# Patient Record
Sex: Male | Born: 1995 | Race: White | Hispanic: No | Marital: Single | Smoking: Former smoker
Health system: Southern US, Community
[De-identification: ages and names within clinical notes are randomized; demographics above are authoritative.]

## PROBLEM LIST (undated history)

## (undated) DIAGNOSIS — F419 Anxiety disorder, unspecified: Secondary | ICD-10-CM

## (undated) DIAGNOSIS — F909 Attention-deficit hyperactivity disorder, unspecified type: Secondary | ICD-10-CM

## (undated) DIAGNOSIS — I10 Essential (primary) hypertension: Secondary | ICD-10-CM

## (undated) DIAGNOSIS — Z889 Allergy status to unspecified drugs, medicaments and biological substances status: Secondary | ICD-10-CM

## (undated) HISTORY — DX: Essential (primary) hypertension: I10

## (undated) HISTORY — DX: Attention-deficit hyperactivity disorder, unspecified type: F90.9

## (undated) HISTORY — DX: Allergy status to unspecified drugs, medicaments and biological substances: Z88.9

## (undated) HISTORY — PX: TYMPANOSTOMY TUBE PLACEMENT: SHX32

## (undated) HISTORY — DX: Anxiety disorder, unspecified: F41.9

## (undated) HISTORY — PX: ADENOIDECTOMY: SHX5191

---
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
## 2014-06-06 IMAGING — CT CT ABD-PELV W/O CM
1 series · 15 of 32 positions shown, 19 images · non-contrast
Comparison: none

REASON FOR EXAM: (1) Foreign body; (2) same
COMMENTS:

PROCEDURE:     CT  - CT ABDOMEN AND PELVIS W[DATE] [DATE]
RESULT:     Comparison: None
TECHNIQUE: Multiple axial images from the lung bases to the symphysis pubis
were obtained without oral and without intravenous contrast.

[Series 2: soft tissue · axial · 0.71mm/px · z∈[-876,-414]mm · 15 of 171 slices shown, 19 images]
[im 11/171  soft-tissue]
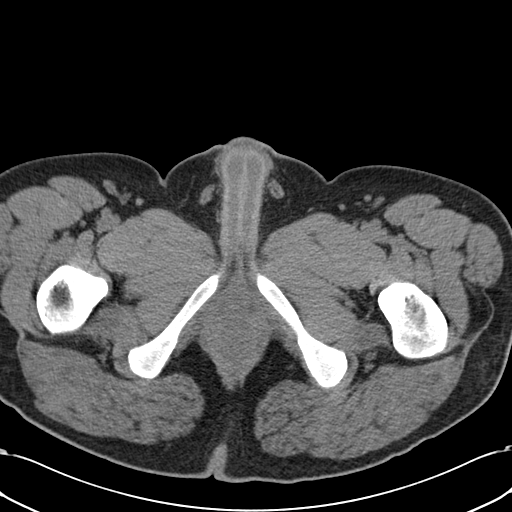
[im 11/171  bone]
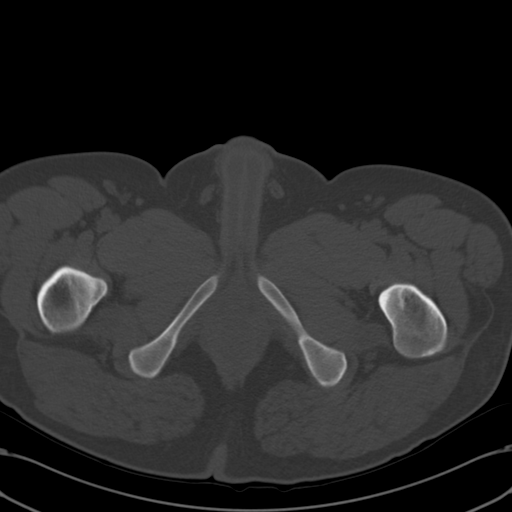
[im 22/171  soft-tissue]
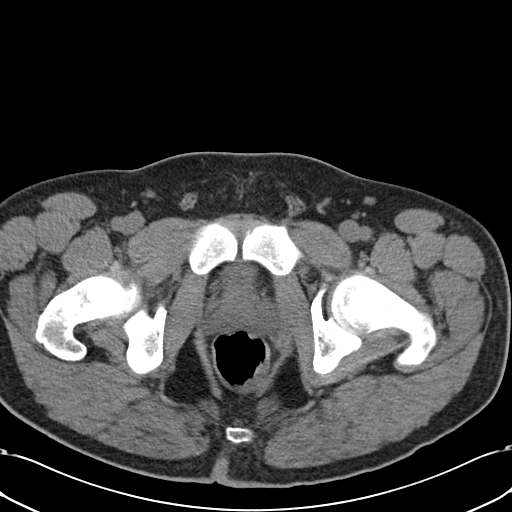
[im 33/171  soft-tissue]
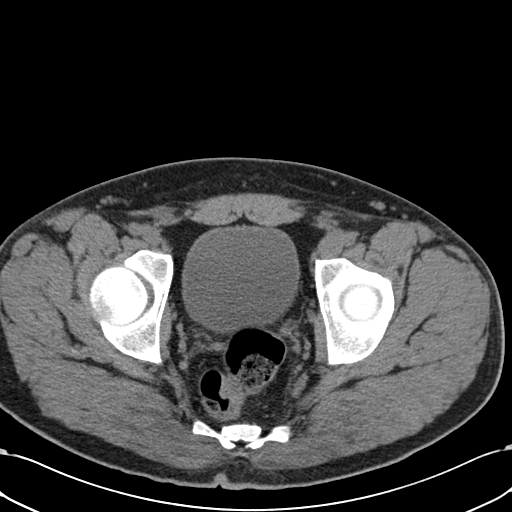
[im 50/171  soft-tissue]
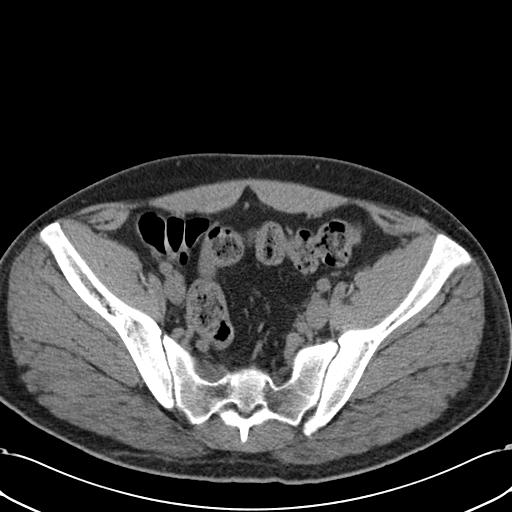
[im 61/171  soft-tissue]
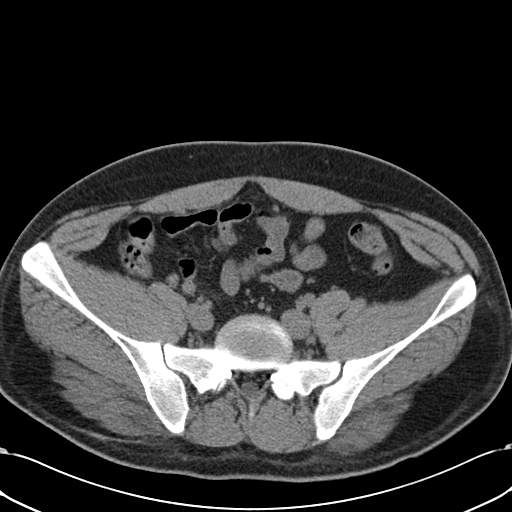
[im 72/171  soft-tissue]
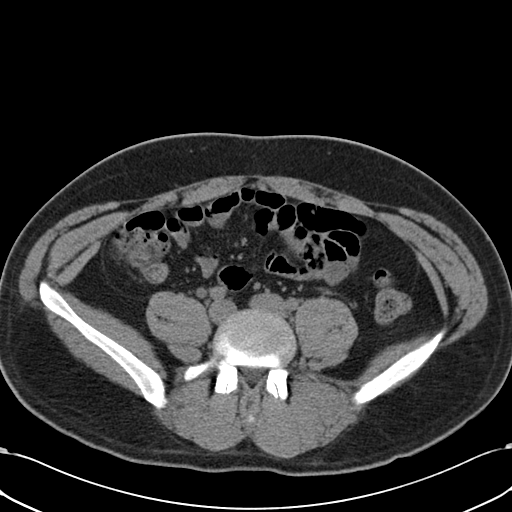
[im 88/171  soft-tissue]
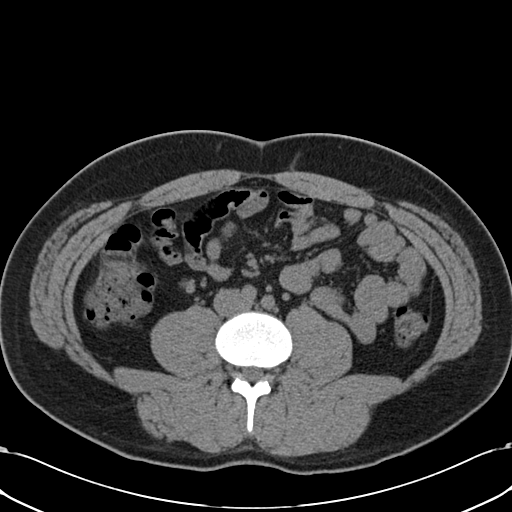
[im 99/171  soft-tissue]
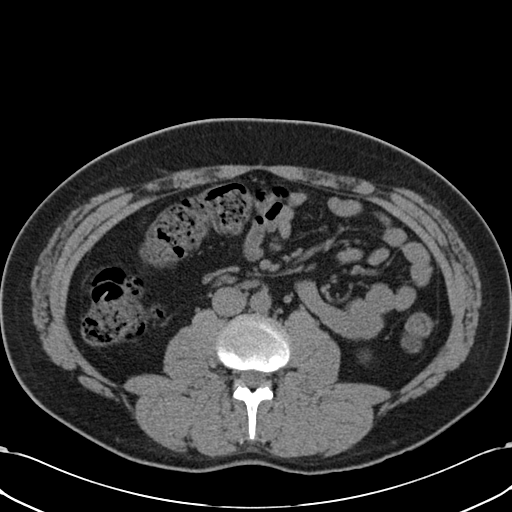
[im 110/171  soft-tissue]
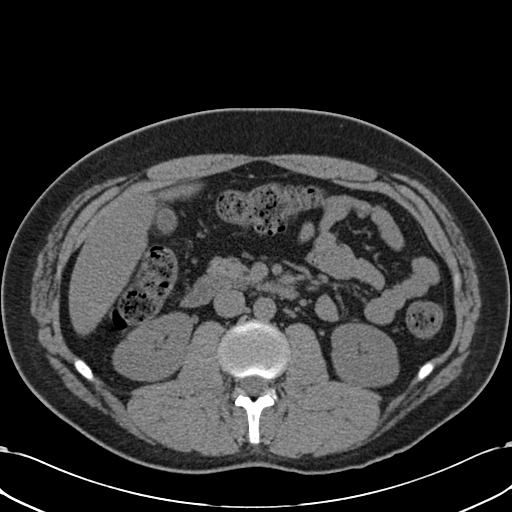
[im 110/171  bone]
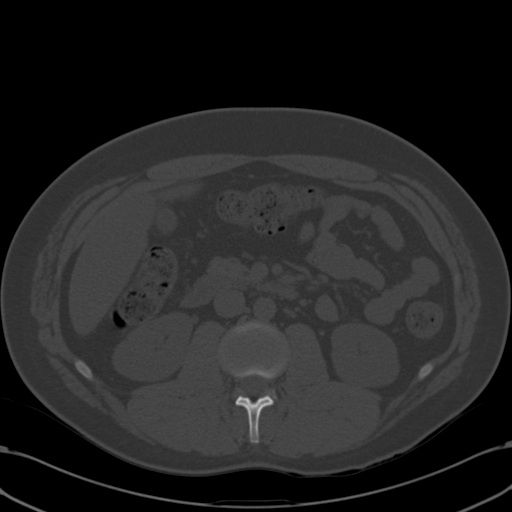
[im 121/171  soft-tissue]
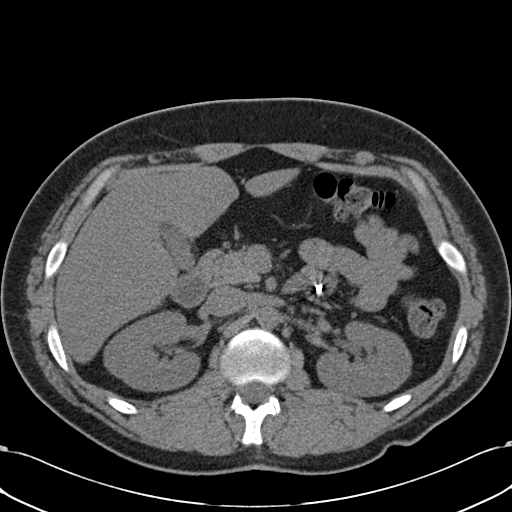
[im 138/171  soft-tissue]
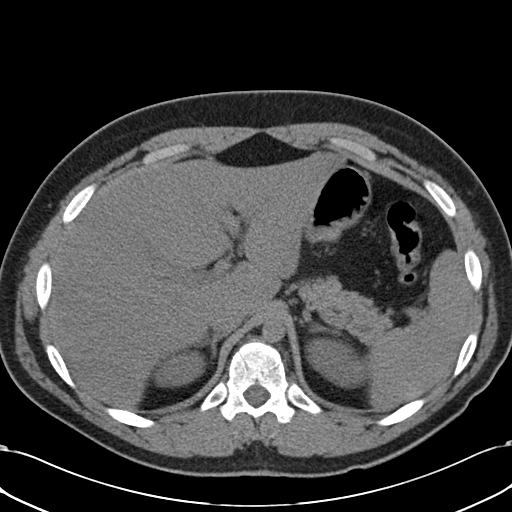
[im 149/171  soft-tissue]
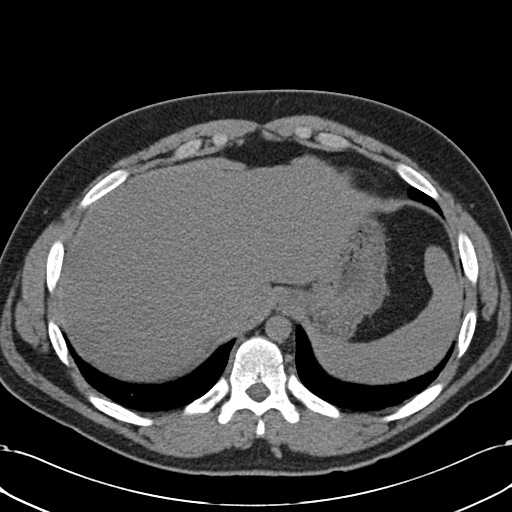
[im 149/171  lung]
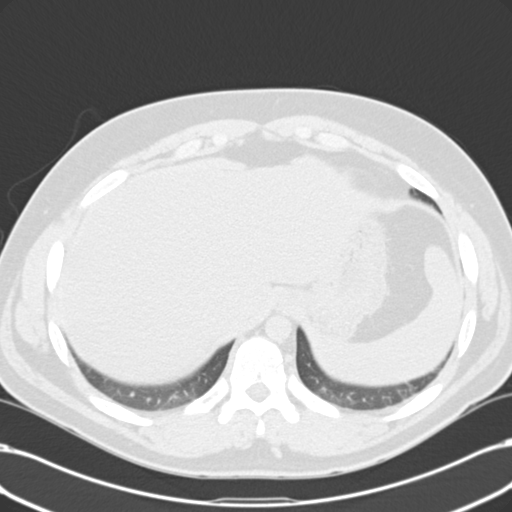
[im 154/171  lung]
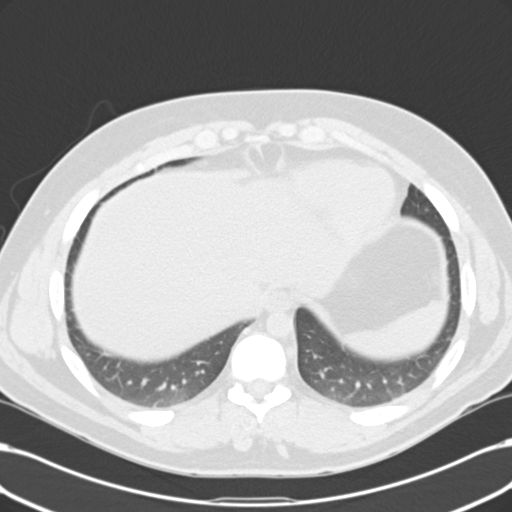
[im 160/171  soft-tissue]
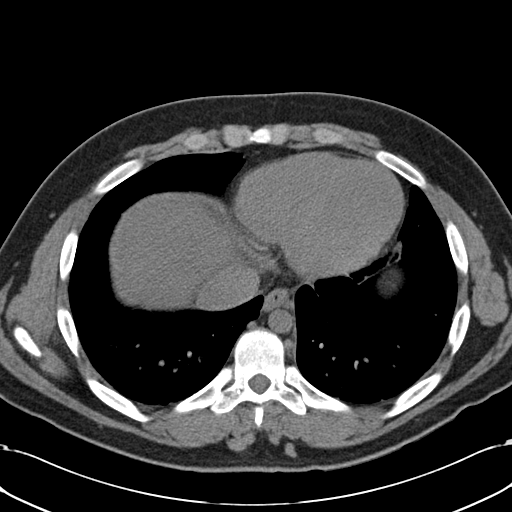
[im 160/171  lung]
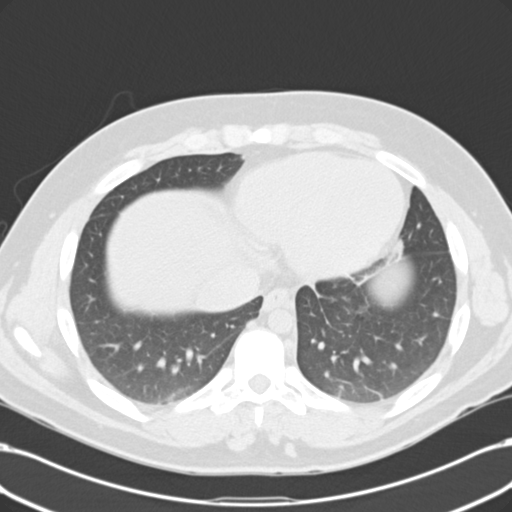
[im 165/171  lung]
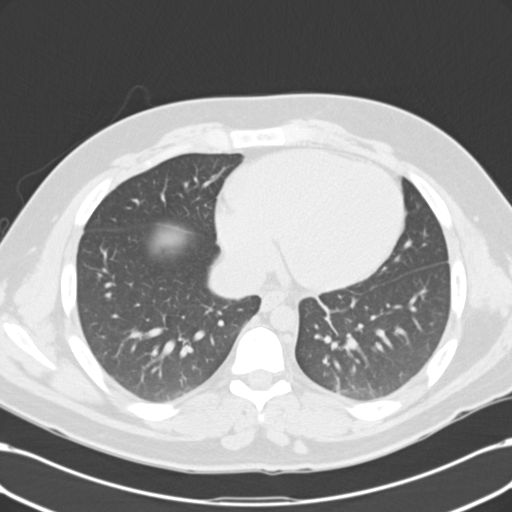

[15 of 32 positions shown; findings below may reference images not displayed]

FINDINGS: Mild basilar opacities are likely secondary to atelectasis.

Lack of intravenous contrast limits evaluation of the solid abdominal
organs.  The liver, spleen, adrenals, and pancreas are unremarkable. There
is suggestion of a is amount of pericholecystic stranding or gallbladder
wall thickening. Correlate for cholecystitis. No renal calculi or
hydronephrosis.

There is a nail within the small bowel in the left upper quadrant, likely at
the junction of the duodenum and jejunum. The tip of the nail approaches the
bowel wall and it is difficult to determine is the tip extends just
extraluminally. However, there is no significant adjacent free fluid. No
free intraperitoneal air.

The small and large bowel are normal in caliber. The appendix is mildly
enlarged by size criteria, measuring 7 mm in diameter. However, there are no
adjacent inflammatory changes.

No aggressive lytic or sclerotic osseous lesions are identified.
IMPRESSION: 1. There is a nail within the small bowel in the left upper quadrant, likely
at the junction of the duodenum and jejunum. The tip of the nail approaches
the bowel wall and it is difficult to determine if it extends just
extraluminally. However, there is no significant adjacent fluid or free
intraperitoneal air.
2. There is suggestion of a trace amount of gallbladder wall thickening or
pericholecystic stranding. Correlate for cholecystitis.
3. The appendix is mildly enlarged by size criteria. However, no adjacent
inflammatory changes. Clinical correlation is recommended.

## 2014-06-06 IMAGING — CR DG ABDOMEN 2V
1 series · 3 of 3 positions shown · non-contrast
Comparison: none

REASON FOR EXAM: f/u foreign body
COMMENTS:

PROCEDURE:     DXR - DXR ABDOMEN 2 V FLAT AND ERECT  - April 10, 2013  [DATE]
RESULT:

[Series 5: w abdomen upright · 0.14mm/px · 3 of 3 slices shown]
[im 1/3]
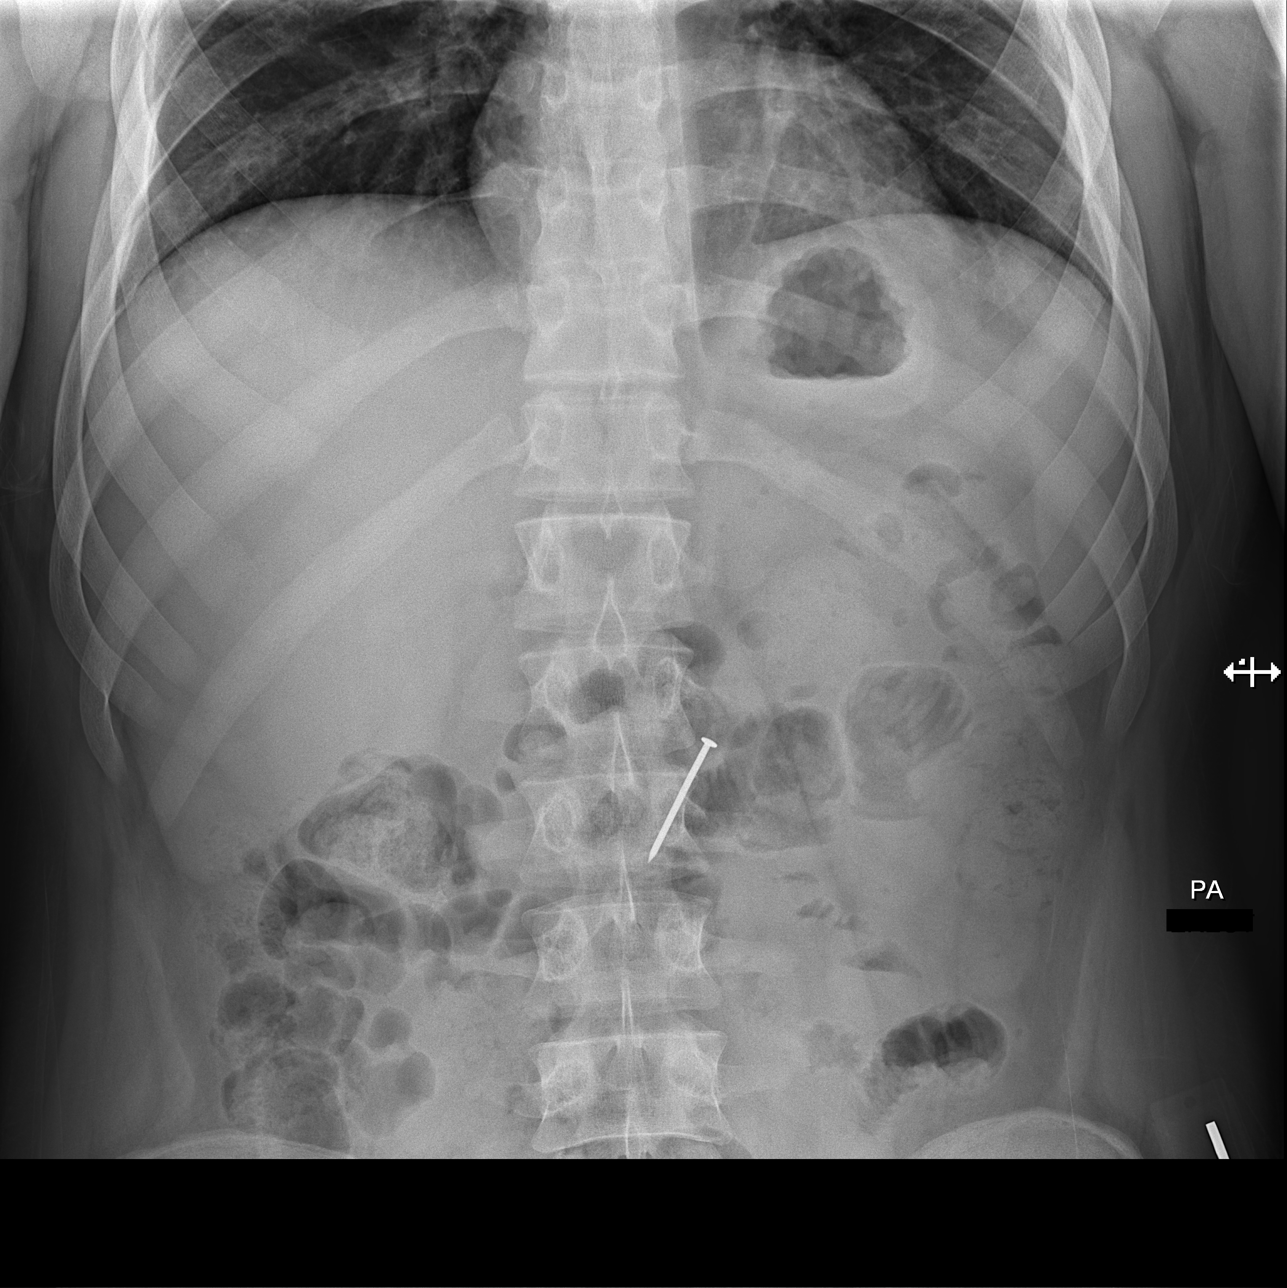
[im 2/3]
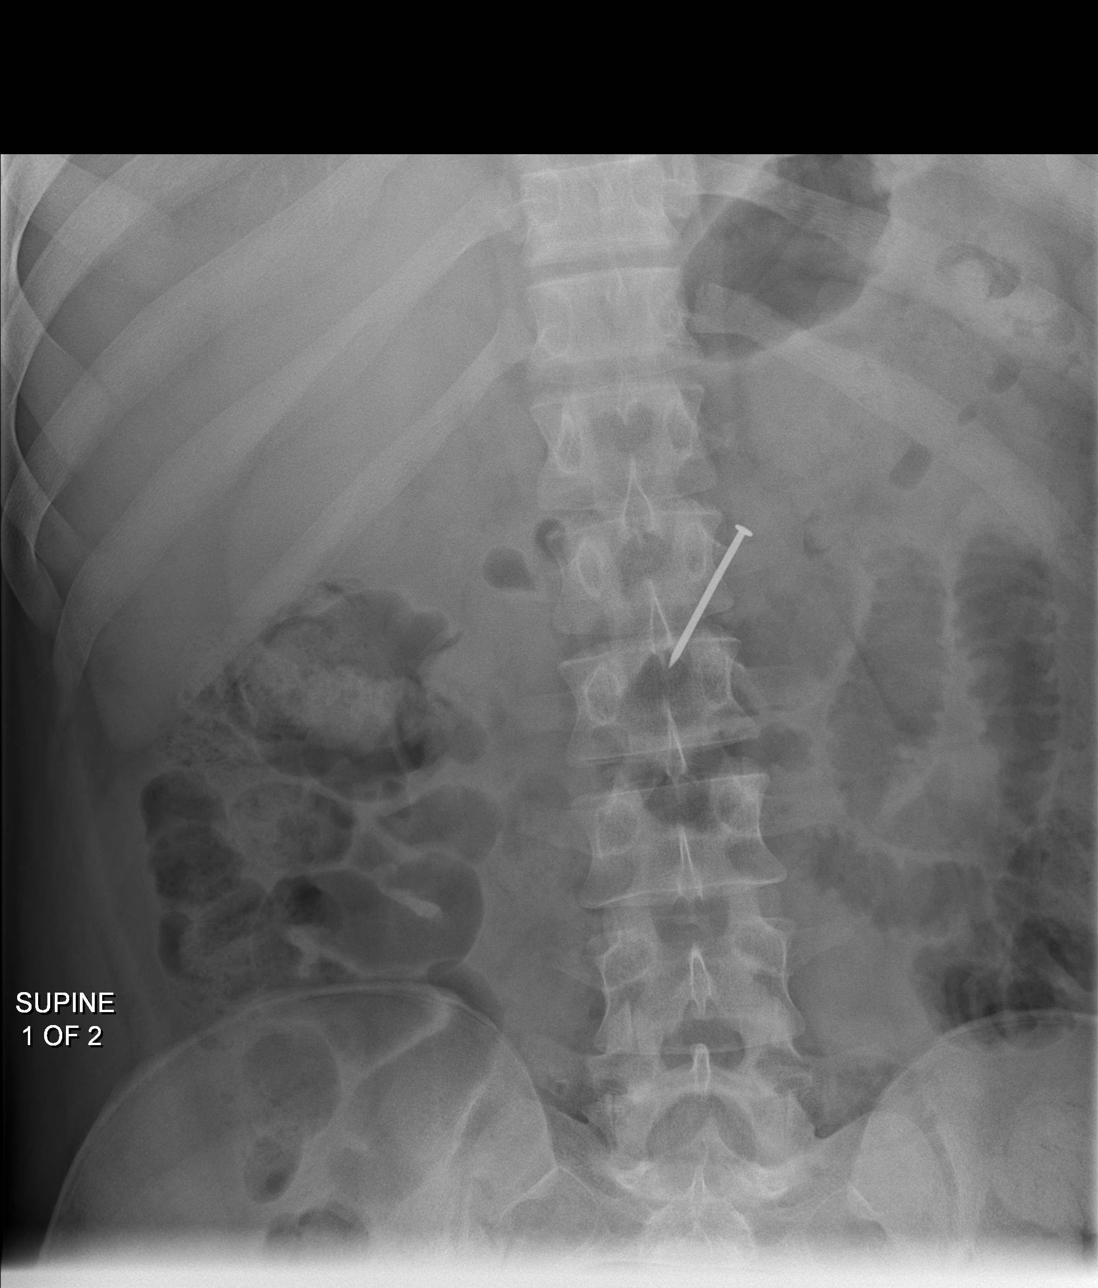
[im 3/3]
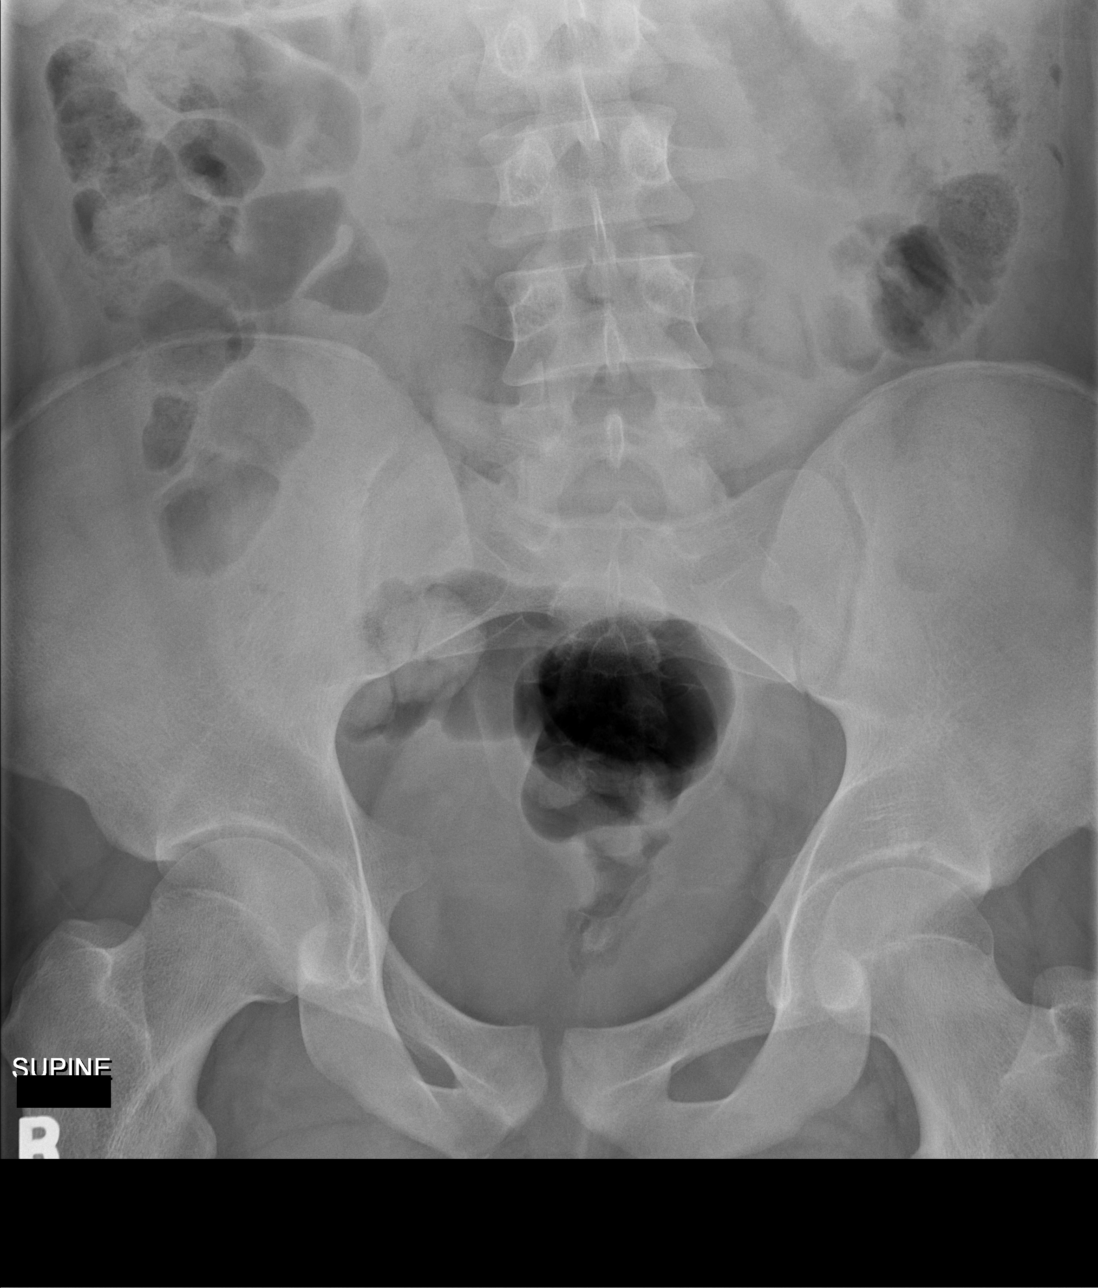

[3 of 3 positions shown; findings below may reference images not displayed]

FINDINGS: Air is seen within nondilated loops of large and small bowel.
Moderate amount of stool is appreciated within the colon. When compared to
the previous study dated 04/09/2013, a radiopaque foreign body projects
within the central portion of the abdomen. This has the appearance of a
nail. There has been slight medial migration of the foreign body. There is
no evidence of free air which would raise concern of perforation. The
visualized bony skeleton is unremarkable.
IMPRESSION: Radiopaque foreign body projecting in the central abdominal
region concerning for a nail. Differential considerations are oral ingestion
versus percutaneous penetration.

## 2014-06-07 IMAGING — CR DG ABDOMEN 1V
1 series · 1 of 1 positions shown · non-contrast
Comparison: none

REASON FOR EXAM: nail in intestine
COMMENTS:

[t abdomen supine]
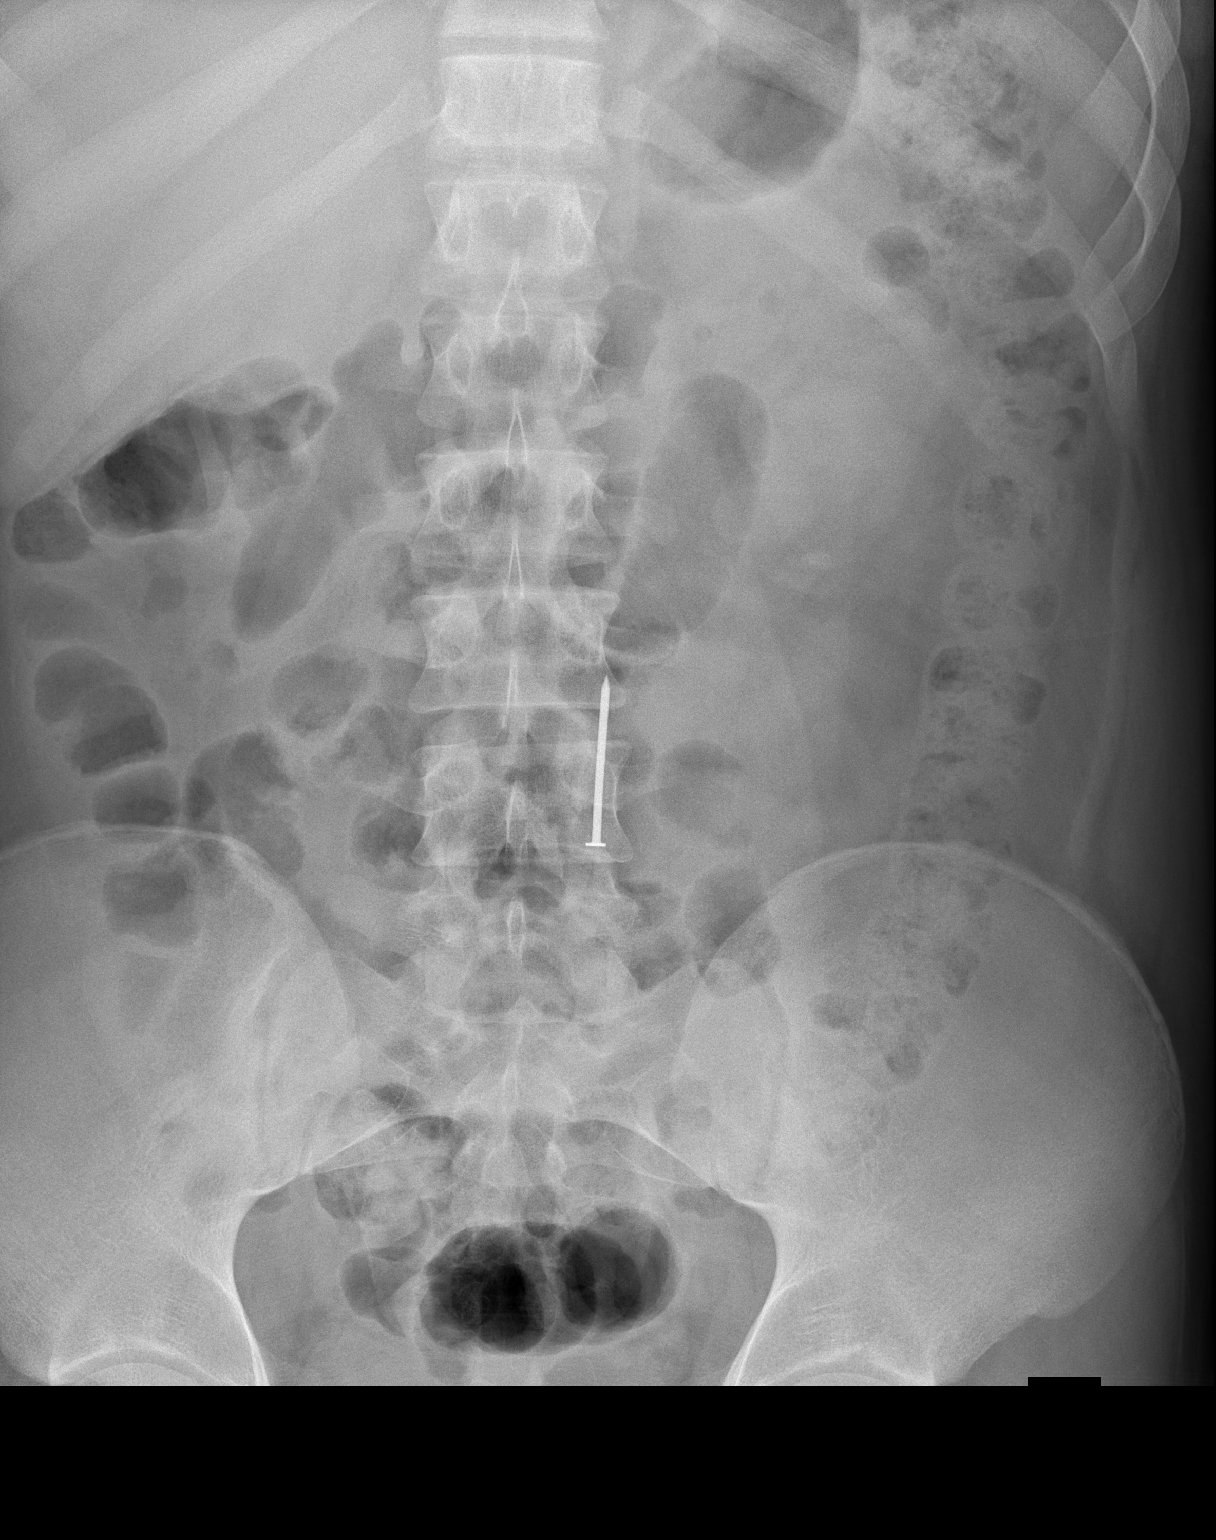

[1 of 1 positions shown; findings below may reference images not displayed]

PROCEDURE:     DXR - DXR KIDNEY URETER BLADDER  - April 11, 2013  [DATE]

RESULT:     In single view the abdomen shows metallic object projecting over
the left L3 vertebral body consistent with the ingested nail. A point is
directed superiorly. The nail has a vertical orientation. This likely
remains in the small bowel.
IMPRESSION: Foreign body remaining in the mid abdominal region likely
in a small bowel loop.

[REDACTED]

## 2014-08-11 ENCOUNTER — Emergency Department: Payer: Self-pay | Admitting: Emergency Medicine

## 2014-08-11 LAB — CBC WITH DIFFERENTIAL/PLATELET
BASOS ABS: 0.1 10*3/uL (ref 0.0–0.1)
Basophil %: 1.1 %
EOS PCT: 3.7 %
Eosinophil #: 0.3 10*3/uL (ref 0.0–0.7)
HCT: 49 % (ref 40.0–52.0)
HGB: 16.3 g/dL (ref 13.0–18.0)
LYMPHS PCT: 32.8 %
Lymphocyte #: 3 10*3/uL (ref 1.0–3.6)
MCH: 28.6 pg (ref 26.0–34.0)
MCHC: 33.2 g/dL (ref 32.0–36.0)
MCV: 86 fL (ref 80–100)
MONO ABS: 0.4 x10 3/mm (ref 0.2–1.0)
Monocyte %: 4.8 %
NEUTROS ABS: 5.3 10*3/uL (ref 1.4–6.5)
NEUTROS PCT: 57.6 %
PLATELETS: 316 10*3/uL (ref 150–440)
RBC: 5.7 10*6/uL (ref 4.40–5.90)
RDW: 12.9 % (ref 11.5–14.5)
WBC: 9.2 10*3/uL (ref 3.8–10.6)

## 2014-08-11 LAB — URINALYSIS, COMPLETE
BACTERIA: NONE SEEN
Bilirubin,UR: NEGATIVE
Blood: NEGATIVE
GLUCOSE, UR: NEGATIVE mg/dL (ref 0–75)
KETONE: NEGATIVE
Leukocyte Esterase: NEGATIVE
Nitrite: NEGATIVE
Ph: 6 (ref 4.5–8.0)
Protein: NEGATIVE
RBC, UR: NONE SEEN /HPF (ref 0–5)
SQUAMOUS EPITHELIAL: NONE SEEN
Specific Gravity: 1.017 (ref 1.003–1.030)
WBC UR: <1 /HPF (ref 0–5)

## 2014-08-11 LAB — LIPASE, BLOOD: Lipase: 137 U/L (ref 73–393)

## 2014-08-11 LAB — COMPREHENSIVE METABOLIC PANEL
ALT: 51 U/L
Albumin: 4.1 g/dL (ref 3.8–5.6)
Alkaline Phosphatase: 115 U/L
Anion Gap: 8 (ref 7–16)
BUN: 12 mg/dL (ref 7–18)
Bilirubin,Total: 1 mg/dL (ref 0.2–1.0)
Calcium, Total: 9.2 mg/dL (ref 9.0–10.7)
Chloride: 102 mmol/L (ref 98–107)
Co2: 29 mmol/L (ref 21–32)
Creatinine: 0.82 mg/dL (ref 0.60–1.30)
EGFR (African American): >60
EGFR (Non-African Amer.): >60
Glucose: 117 mg/dL — ABNORMAL HIGH (ref 65–99)
Osmolality: 278 (ref 275–301)
POTASSIUM: 4.1 mmol/L (ref 3.5–5.1)
SGOT(AST): 31 U/L (ref 10–41)
Sodium: 139 mmol/L (ref 136–145)
Total Protein: 8 g/dL (ref 6.4–8.6)

## 2014-09-28 ENCOUNTER — Emergency Department: Payer: Self-pay | Admitting: Emergency Medicine

## 2015-03-06 NOTE — Consult Note (Signed)
Chief Complaint:  Subjective/Chief Complaint Asked by Dr. Mechele CollinElliott to see patient. Pt without any abd pain or nausea. Hungry. CT just done showing passage of nail into small intestine.   VITAL SIGNS/ANCILLARY NOTES: **Vital Signs.:   28-May-14 10:02  Vital Signs Type Q 4hr  Temperature Temperature (F) 97.8  Celsius 36.5  Temperature Source oral  Pulse Pulse 62  Respirations Respirations 20  Systolic BP Systolic BP 124  Diastolic BP (mmHg) Diastolic BP (mmHg) 77  Mean BP 92  Pulse Ox % Pulse Ox % 97  Pulse Ox Activity Level  At rest  Oxygen Delivery Room Air/ 21 %   Brief Assessment:  Cardiac Regular   Respiratory clear BS   Gastrointestinal Normal   Lab Results: Hepatic:  28-May-14 04:14   Bilirubin, Total 0.6  Alkaline Phosphatase 116  SGPT (ALT) 30  SGOT (AST) 24  Total Protein, Serum  6.3  Albumin, Serum  3.4  Routine Chem:  28-May-14 04:14   Glucose, Serum 94  BUN 11  Creatinine (comp) 0.79  Sodium, Serum 140  Potassium, Serum 4.0  Chloride, Serum 107  CO2, Serum  27  Calcium (Total), Serum  8.8  Osmolality (calc) 279  Anion Gap  6 (Result(s) reported on 10 Apr 2013 at 04:56AM.)  Routine Hem:  28-May-14 04:14   WBC (CBC) 7.4  RBC (CBC) 4.42  Hemoglobin (CBC)  12.8  Hematocrit (CBC)  36.4  Platelet Count (CBC) 259  MCV 82  MCH 28.9  MCHC 35.1  RDW 13.8  Neutrophil % 45.5  Lymphocyte % 40.7  Monocyte % 6.8  Eosinophil % 5.8  Basophil % 1.2  Neutrophil # 3.4  Lymphocyte # 3.0  Monocyte # 0.5  Eosinophil # 0.4  Basophil # 0.1 (Result(s) reported on 10 Apr 2013 at 04:56AM.)   Assessment/Plan:  Assessment/Plan:  Assessment Foreign body. Now in small intestine.   Plan Not able to reach nail with scope at this time. Ordered full liquid diet. Serial KUB. Will let Dr. Mechele CollinElliott know of CT.   Electronic Signatures: Lutricia Feilh, Carisha Kantor (MD)  (Signed 28-May-14 14:24)  Authored: Chief Complaint, VITAL SIGNS/ANCILLARY NOTES, Brief Assessment, Lab Results,  Assessment/Plan   Last Updated: 28-May-14 14:24 by Lutricia Feilh, Terin Cragle (MD)

## 2015-03-06 NOTE — Consult Note (Signed)
I will sign off, reconsult if I can be of service.  Electronic Signatures: Scot JunElliott, Robert T (MD)  (Signed on 29-May-14 15:03)  Authored  Last Updated: 29-May-14 15:03 by Scot JunElliott, Robert T (MD)

## 2015-03-06 NOTE — Consult Note (Signed)
PATIENT NAME:  Renold GentaHILL, Aadam S MR#:  [id number] DATE OF BIRTH:  01-Nov-1996  DATE OF CONSULTATION:  01/10/2013  CONSULTING PHYSICIAN:  Cristal Deerhristopher A. Ogle Hoeffner, MD  REASON FOR CONSULTATION:  Foreign body ingestion.   HISTORY OF PRESENT ILLNESS: Mr. Rana SnareLowe is a pleasant 19 year old male with history of bipolar disorder and attention deficit/hyperactivity disorder, who presented after swallowing a 1.5 inch nail at approximately 5:15 p.m. He says that he had been on his mouth and he felt tickling in the back of his throat and he accidentally swallowed. Otherwise, no fevers, chills, night sweats, shortness of breath, cough, abdominal pain, nausea, vomiting, diarrhea, constipation, dysuria or hematuria.   HOME MEDICATIONS:  1.  QVAR 80 mcg inhalation 1 puff b.i.d. 2.  Lisinopril 20 mg p.o. daily.  3.  Fluticasone 50 mcg inhaler two sprays daily.  4.  Adderall XR 20 mg 1 tab p.o. daily.   ALLERGIES: No known drug allergies.   PAST MEDICAL HISTORY:  1. Bipolar disorder.  2.  Asthma.  3.  Attention deficit/hyperactivity disorder.  4.  History of adenoidectomy and ear tubes.    SOCIAL HISTORY: Current smoker, denies alcohol or illegal drug use.   REVIEW OF SYSTEMS:  A 12-poing review of systems  was obtained. Pertinent positives and negatives as above.   PHYSICAL EXAMINATION: VITAL SIGNS: Temperature 97.8, pulse 96, blood pressure 131/72, respirations 18, 98% on room air.  GENERAL: No acute distress. Alert and oriented x 3.    HEAD: Normocephalic, atraumatic.  EYES: No scleral icterus. No conjunctivitis.  CHEST: Lungs clear to auscultation, moving air well.  HEART: Regular rate and rhythm.  No murmurs, rubs or gallops.   ABDOMEN: Soft, nontender, nondistended.  EXTREMITIES: Moves all extremities well. Strength 5/5.  NEUROLOGIC: Cranial nerves II through XII grossly intact n all 4 extremities.   LABORATORY:  No labs.   RADIOLOGY:  Plain abdominal x-ray shows a nail in what appears to be  the distal stomach as well as there is an air-fluid in the stomach.    ASSESSMENT AND PLAN: Mr. Loleta ChanceHill is a pleasant 19 year old with a recent ingestion of the nail. It is my belief that could be who presents in his stomach and may there be an opportunity to retrieve this endoscopically and thus preventing Mr. Loleta ChanceHill from requiring serial KUBs and possible need for laparotomy if it gets stuck in the the duodenum or TI or if he perforates. I have discussed with Dr. Mechele CollinElliott who is evaluating the patient.  We will continue to follow.    ____________________________ Si Raiderhristopher A. Ajai Harville, MD cal:cc D: 04/09/2013 21:55:49 ET T: 04/09/2013 22:10:25 ET JOB#: [id number]  cc: Cristal Deerhristopher A. Keyosha Tiedt, MD, <Dictator> Jarvis NewcomerHRISTOPHER A Sherel Fennell MD ELECTRONICALLY SIGNED 04/11/2013 20:38

## 2015-03-06 NOTE — H&P (Signed)
PATIENT NAME:  Andrew Johnston, Andrew Johnston MR#:  [id number] DATE OF BIRTH:  April 17, 1996  DATE OF ADMISSION:  04/09/2013  HISTORY OF PRESENT ILLNESS: The patient is an 19 year old white male who ate about 5:00 and had a nail in his mouth about the size that you hand a picture with, and he accidentally swallowed this. An x-ray was done in the ER showing the nail in the left upper quadrant, probably body or fundus, and I was asked to see him.   PAST MEDICAL HISTORY: He has never had any surgery before. He does have asthma, and he uses Qvar for this.   MEDICATIONS: Adderall; he is to resume this soon. Lisinopril for hypertension. Qvar for asthma.   HABITS: The patient does smoke.   PHYSICAL EXAMINATION:  GENERAL: Mesomorphic white male in no acute distress. A little anxious, understandably so.  HEENT: Sclerae nonicteric. Conjunctivae negative. Oropharynx is negative.  CHEST: Clear.  HEART: Shows no murmurs, gallops, clicks or rubs.  ABDOMEN: Bowel sounds present. No hepatosplenomegaly. No masses.   ASSESSMENT AND PLAN: Lambert ModySharp foreign body in the stomach after accidental ingestion. This needs to be removed if at all possible endoscopically. Plan to call a second OR crew in and do an upper endoscopy with foreign body removal.    ____________________________ Scot Junobert T. Nainoa Woldt, MD rte:gb D: 04/09/2013 22:29:32 ET T: 04/09/2013 22:59:58 ET JOB#: [id number]  cc: Scot Junobert T. Julann Mcgilvray, MD, <Dictator> Scot JunOBERT T Janaysha Depaulo MD ELECTRONICALLY SIGNED 04/24/2013 7:13

## 2015-03-06 NOTE — H&P (Signed)
PATIENT NAME:  Andrew Johnston, Andrew Johnston MR#:  [id number] DATE OF BIRTH:  October 28, 1996  DATE OF ADMISSION:  04/09/2013  ADDENDUM  ALLERGIES:  No known drug allergies.   VITAL SIGNS:  In the ER at the time of 1854 hours, temp 97.8, pulse 96, respirations 18, blood pressure 131/72, 98% on room air.   The dosage on his QVAR is 80 mcg per inhalation, 1 puff twice a day;  lisinopril 20 mg a day; fluticasone nasal spray 50 mcg per inhalation, 2 sprays once a day; Adderall XR 20 mg once a day.   The patient has a history of asthma, ADHD, bipolar disorder and tubes in his ears and adenoidectomy and also a history of fatty liver.   ____________________________ Scot Junobert T. Harout Scheurich, MD rte:rp D: 04/09/2013 22:33:32 ET T: 04/09/2013 23:01:34 ET JOB#: [id number]  cc: Scot Junobert T. Nazli Penn, MD, <Dictator> Scot JunOBERT T Giovannie Scerbo MD ELECTRONICALLY SIGNED 04/24/2013 7:13

## 2015-03-08 NOTE — H&P (Signed)
PATIENT NAME:  Andrew Johnston, Andrew Johnston MR#:  [id number] DATE OF BIRTH:  [birth year]  DATE OF ADMISSION:  04/11/2012  REFERRING PHYSICIAN: ER physician Dr. Daphine Deutscher  PRIMARY CARE PHYSICIAN: Dr. Collier Salina at [facility]  PSYCHIATRIST: Dr. Lucianne Muss at [facility]    CHIEF COMPLAINT: Altered mental status.   HISTORY OF PRESENT ILLNESS: Patient is a 19 year old male with past medical history of attention deficit/hyperactivity disorder with anger issues and mood swings, questionable history of bipolar disorder, hypertension, asthma, allergies who was brought in by his mother for altered mental status. Patient has a long history psychiatric problems and was in a level IV psychiatric facility for 11 months. Patient has been living home with his mother since September 2012 and has been without any issues. The patient's mother reports that last night patient had a severe episode of mood swings with anger and outrage which was so severe that she had to leave the house for the patient to calm down. This morning when the patient woke up he was not acting right but seemed to settle down and ate his breakfast and walked to school which he usually does. Within a couple of hours the patient's mother received a call from the school that he was lethargic, not acting right and was difficult to arouse therefore she went to pick up the patient and brought him to the Emergency Room where patient continues to be somnolent but easily arousable. He reports that he is feeling fatigued and drowsy. He denies taking any drugs but reports that he took 10 tablets of Tenex on Monday night because he was feeling angry, edgy and restless and felt that it will help calm him down and control his blood pressure a little bit better. Patient's urine drug screen is negative at present. His vital signs are stable. Patient reports that he occasionally smokes and drinks but adamantly denies using any drugs other than smoking marijuana a couple of times. The  patient was exam and interviewed in the presence of his mother. He denied any suicidal or homicidal ideation.  ALLERGIES: No known drug allergies.   PAST MEDICAL HISTORY:  1. Attention deficit/hyperactivity disorder with anger issues and mood swings. 2. Questionable history of bipolar disorder. 3. Hypertension. 4. Asthma. 5. Allergies.  6. Patient has had extensive psychiatric history since childhood and has had multiple inpatient behavior medicine admissions, the last one was for 11 months. Patient has been home since 07/2011. The mother reports that patient had become suicidal on Lexapro and had started cutting himself.   PAST SURGICAL HISTORY:  1. History of ear tubes. 2. Adenoidectomy. 3. Finger abscess status post incision and drainage.   SOCIAL HISTORY: Smokes a couple of cigarettes every day. Patient reports that he occasionally drinks beer and has used marijuana in the past. He lives with his mother. He is in the eleventh grade.   FAMILY HISTORY: Mother has no problems. Father has extensive psychiatric problems.   REVIEW OF SYSTEMS: CONSTITUTIONAL: Denies any fever or weight loss. Reports fatigue. EYES: Denies any blurred or double vision. ENT: Denies any tinnitus, ear pain. RESPIRATORY: Denies any cough, wheezing. CARDIOVASCULAR: Denies any chest pain, palpitations. GASTROINTESTINAL: Denies any nausea, vomiting, diarrhea, abdominal pain. GENITOURINARY: Denies any dysuria, hematuria. ENDO: Denies any polyuria, nocturia. HEME/LYMPH: Denies any anemia, easy bruisability. INTEGUMENT: Denies any acne, rash. MUSCULOSKELETAL: Denies any swelling, gout. NEUROLOGICAL: Denies any numbness, weakness. PSYCH: Has attention deficit/hyperactivity disorder with anger issues and mood swings.   PHYSICAL EXAMINATION:  VITAL SIGNS: Temperature 95.8,  heart rate 51, respiratory rate 18, blood pressure 152/106, pulse ox 98% on room air.   GENERAL: Patient is a young Caucasian male lying comfortably in  bed, not in acute distress. He is lethargic but easily arousable and oriented x3.   HEAD: Atraumatic, normocephalic.   EYES: There is no pallor, icterus, or cyanosis. Pupils equal, round, and reactive to light and accommodation. Extraocular movements intact.   ENT: Wet mucous membranes. No oropharyngeal erythema or thrush.   NECK: Supple. No masses. No JVD. No thyromegaly or lymphadenopathy.   CHEST WALL: No tenderness to palpation. Not using accessory muscles of respiration. No intercostal muscle retractions.   LUNGS: Bilaterally clear to auscultation. No wheezing, rales, or rhonchi.   CARDIOVASCULAR: S1, S2 regular. No murmur, rubs, or gallops.   ABDOMEN: Soft, nontender, nondistended. No guarding. No rigidity. No organomegaly. Normal bowel sounds.   SKIN: No rash. No lesion. Patient does have some acne on his face.   PERIPHERIES: 2+ pedal pulses. No pedal edema.   MUSCULOSKELETAL: No cyanosis or clubbing.   NEUROLOGICAL: Patient is lethargic but arousable. He is oriented x3. Nonfocal neurological exam. Cranial nerves grossly intact.   PSYCH: Patient intermittently irritable.   LABORATORY, DIAGNOSTIC, AND RADIOLOGICAL DATA: CAT scan of the head showed no acute abnormality. Glucose 121, BUN 14, creatinine 0.71, sodium 136, potassium 4.2, chloride 100, CO2 29, bilirubin 1.5. LFTs are normal. CBC normal. Urine drug screen shows no evidence of infection. UDS is negative.   ASSESSMENT AND PLAN: 19 year old male with past medical history of attention deficit/hyperactivity disorder, asthma presents with altered mental status, somnolence.  1. Altered mental status, possibly due to overdose of Tenex. Patient reports that he took 10 tablets of Tenex on Monday night because he was feeling angry, agitated. He denied any suicidal ideation. He denies using any drugs. He does admit to smoking and occasional alcohol use and marijuana use. His drug screen was negative. Will admit the patient for  observation. We will get psychiatry consultation and place on suicidal precautions. Will hold patient's Tenex for the time being. Tenex profile was reviewed. It has a half life of sixteen hours, in 45% cases can cause somnolence. It can also cause hypertension, fatigue.  2. Hypertension. Will continue patient's lisinopril and monitor closely.  3. Hyperglycemia, possibly reactive. Patient has no history of diabetes.  4. Smoking. Patient was counseled for more than three minutes. Will provide with a nicotine patch.  5. Attention deficit/hyperactivity disorder. Will continue patient's Adderall and hold his Tenex for the time being. 6. Asthma. Stable. No evidence of wheezing. Will continue Advair and p.r.n. nebulizer treatment.  7. History of allergies. Will continue Allegra.   Reviewed old medical records, discussed with the ED physician, discussed with the patient and his mother the plan of care and management.  TIME SPENT: 75 minutes.   ____________________________ Darrick MeigsSangeeta Maguadalupe Lata, MD sp:cms D: 04/11/2012 15:38:05 ET T: 04/11/2012 16:22:47 ET JOB#: [id number]  cc: Darrick MeigsSangeeta Lola Czerwonka, MD, <Dictator> [facility]les [facility] Darrick MeigsSANGEETA Drinda Belgard MD ELECTRONICALLY SIGNED 04/11/2012 18:48

## 2015-03-08 NOTE — Discharge Summary (Signed)
PATIENT NAME:  Andrew Andrew Johnston, Andrew Andrew Johnston MR#:  [id number] DATE OF BIRTH:  Apr 25, 1996  DATE OF ADMISSION:  04/11/2012 DATE OF DISCHARGE:  04/13/2012  DIAGNOSES:  1. Altered mental status due to drug overdose.  2. Bradycardia, possibly due to drug overdose side effect.  3. Hypertension, controlled.  4. Attention deficit/hyperactivity disorder.   HOME MEDICATIONS: 1. Lisinopril 20 mg p.o. daily.  2. Advair 250 mcg/50 micrograms inhalation, 2 puffs twice daily.  3. Dextroamphetamine 20 mg p.o. extended release, 1 cap once daily in the morning.  4. Fexofenadine 180 mg p.o. once daily. 5. Qvar 18 mcg inhalation, 1 puff twice daily. 6. Fluticasone 50 mcg, two sprays once daily.    Stop medication guanfacine 1 mg p.o. p.o. b.i.d.   DIET: Regular.   ACTIVITY: As tolerated.  FOLLOWUP: Follow up with PCP within one week.   REASON FOR ADMISSION: Altered mental status.   HOSPITAL COURSE: The patient is a 19 year old Caucasian male with a past medical history of attention deficit hyperactivity disorder with anger issues and mood swings, questionable history of bipolar disorder, hypertension, asthma, and allergies, brought to the ED for altered mental status. The patient'Andrew Johnston mother reported that the patient had similar episode of mood swing with anger and outrage the night before this admission. The next morning the patient woke up and was not acting right but seemed to have settled down.  He ate his breakfast and walked to school. Within a couple of hours the patient'Andrew Johnston mother received a call from the school that the patient was lethargic, not acting right, and was difficult to arouse.  Therefore, she went to pick up the patient and brought him to the Emergency Room where the patient continued to be somnolent, but easily arousable. The patient said he felt fatigued and drowsy.  He said he took ten tablets of Tenex on Monday night because he was feeling angry, edgy, and restless. For a detailed history and physical  examination, please refer to the admission note dictated by Dr. Dava NajjarPanwar. The patient was admitted for altered mental status possibly due to overdose of Tenex, so Tenex was on hold. The patient has been treated with IV fluid support. The patient'Andrew Johnston mental status has been improving since admission, but he has had bradycardia since admission; heart rate is about 50 to 60, which is possibly due to side effect of Tenex. However, the patient'Andrew Johnston vital signs are stable. He has no complaints. He is alert, awake, oriented, in no acute distress. Denies any depression. No suicidal ideation. For attention deficit/hyperactivity disorder the patient has been treated Adderall.  The patient is a smoker. Smoking cessation was counseled both by Dr. Dava NajjarPanwar on me.    PHYSICAL EXAMINATION:  GENERAL:  The patient is alert, awake, oriented, in no acute distress. VITAL SIGNS: Today temperature 97.8, blood pressure 121/58, pulse 51 and this morning it was 64, respirations 16, oxygen saturation 99% on room air. Physical examination is unremarkable.   The patient is clinically stable and will be discharged home today. Since the patient still has bradycardia he needs followup with PCP within one week, but do not take Tenex after discharge. Discussed the patient'Andrew Johnston discharge plan with the patient, the patient'Andrew Johnston mother, the nurse, and the case manager.   TIME SPENT: About 40 minutes.   ____________________________ Andrew PollackQing Andrew Allende, MD qc:bjt D: 04/13/2012 16:42:39 ET T: 04/14/2012 11:28:07 ET JOB#: [id number]  cc: Andrew PollackQing Andrew Bunte, MD, <Dictator> Andrew PollackQING Andrew Karbowski MD ELECTRONICALLY SIGNED 04/15/2012 23:34

## 2016-12-15 ENCOUNTER — Emergency Department
Admission: EM | Admit: 2016-12-15 | Discharge: 2016-12-15 | Disposition: A | Discharge status: Home / Self Care | Payer: Medicaid Other | Attending: Emergency Medicine | Admitting: Emergency Medicine

## 2016-12-15 ENCOUNTER — Encounter: Payer: Self-pay | Admitting: Emergency Medicine

## 2016-12-15 DIAGNOSIS — Z79899 Other long term (current) drug therapy: Secondary | ICD-10-CM | POA: Insufficient documentation

## 2016-12-15 DIAGNOSIS — B9689 Other specified bacterial agents as the cause of diseases classified elsewhere: Secondary | ICD-10-CM

## 2016-12-15 DIAGNOSIS — H1089 Other conjunctivitis: Secondary | ICD-10-CM | POA: Insufficient documentation

## 2016-12-15 DIAGNOSIS — Z87891 Personal history of nicotine dependence: Secondary | ICD-10-CM | POA: Insufficient documentation

## 2016-12-15 DIAGNOSIS — H109 Unspecified conjunctivitis: Secondary | ICD-10-CM

## 2016-12-15 DIAGNOSIS — I10 Essential (primary) hypertension: Secondary | ICD-10-CM | POA: Insufficient documentation

## 2016-12-15 DIAGNOSIS — J45909 Unspecified asthma, uncomplicated: Secondary | ICD-10-CM | POA: Insufficient documentation

## 2016-12-15 MED ORDER — TOBRAMYCIN 0.3 % OP SOLN: 2.0000 [drp] | OPHTHALMIC | 0 refills | Status: AC

## 2016-12-15 NOTE — Discharge Instructions (Signed)
Begin using eyedrops today. Do not share eyedrops with anyone. Wash your hands immediately if touching your eyes. [facility] if any continued problems. This is very contagious. Use drops until completely clear.

## 2016-12-15 NOTE — ED Provider Notes (Signed)
[facility] Emergency Department Provider Note  ____________________________________________   First MD Initiated Contact with Patient 12/15/16 1559     (approximate)  I have reviewed the triage vital signs and the nursing notes.   HISTORY  Chief Complaint Eye Problem   HPI Andrew Johnston is a 21 y.o. male history with complaint of right eye irritation that began over the weekend. Patient states that the irritation has now moved to his left eye. He now wakes up with his lashes matted shut and thick "junk" in the corner of his eye. He wipes his eye frequently to remove this. He denies any pain to his eyes. He has not had any photophobia and does not feel any sensation of foreign body. He states the drainage has gradually increased. He has been using over-[facility] without [facility]. Patient rates his pain as 0/10 right now.   Past Medical History:  Diagnosis Date  . ADHD (attention deficit hyperactivity disorder)    dx at age 89  . Anxiety   . Asthma   . History of seasonal allergies   . Hypertension 2011    Patient Active Problem List   Diagnosis Date Noted  . Attention deficit disorder with hyperactivity(314.01) 11/25/2011  . 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 ADENOIDECTOMY    . TYMPANOSTOMY TUBE PLACEMENT      Prior to Admission medications   Medication Sig Start Date End Date Taking? Authorizing Provider  amphetamine-dextroamphetamine (ADDERALL XR) 20 MG 24 hr capsule Take 1 capsule (20 mg total) by mouth every morning. 07/02/12   Nelly RoutArchana Kumar, MD  ARIPiprazole (ABILIFY) 5 MG tablet Take 1 tablet (5 mg total) by mouth daily. 04/30/12   Nelly RoutArchana Kumar, MD  beclomethasone (QVAR) 40 MCG/ACT inhaler Inhale 2 puffs into the lungs 2 (two) times daily.    Historical Provider, MD  cetirizine (ZYRTEC) 10 MG tablet Take 10 mg by  mouth daily.    Historical Provider, MD  fluticasone Aleda Grana(FLONASE) 50 MCG/ACT nasal spray  03/31/12   Historical Provider, MD  lisinopril (PRINIVIL,ZESTRIL) 10 MG tablet Take 20 mg by mouth daily.     Historical Provider, MD  tobramycin (TOBREX) 0.3 % ophthalmic solution Place 2 drops into both eyes every 4 (four) hours. While awake 12/15/16   Tommi Rumpshonda L Brynnlie Unterreiner, PA-C    Allergies Milk-related compounds  Family History  Problem Relation Age of Onset  . Suicidality Father     Social History Social History  Substance Use Topics  . Smoking status: Former Smoker    Packs/day: 0.50    Years: 3.00    Types: Cigarettes  . Smokeless tobacco: Never Used  . Alcohol use No     Comment: 1x use 1 wine cooler at 21y/o.    Review of Systems Constitutional: No fever/chills Eyes: Positive for irritation. Positive for drainage bilateral eyes. ENT: No sore throat. Cardiovascular: Denies chest pain. Respiratory: Denies shortness of breath. Gastrointestinal:   No nausea, no vomiting.   Skin: Negative for rash. Neurological: Negative for headaches  10-point ROS otherwise negative.  ____________________________________________   PHYSICAL EXAM:  VITAL SIGNS: ED Triage Vitals  Enc Vitals Group     BP --      Pulse --      Resp --      Temp --      Temp Source 12/15/16 1557 Oral  SpO2 --      Weight 12/15/16 1555 172 lb (78 kg)     Height 12/15/16 1555 5\' 11"  (1.803 m)     Head Circumference --      Peak Flow --      Pain Score --      Pain Loc --      Pain Edu? --      Excl. in GC? --     Constitutional: Alert and oriented. Well appearing and in no acute distress. Eyes: Conjunctivae are Mildly erythematous. There is some minimal yellow thick exudate in the corner of the left eye. Patient states that he wiped both eyes just prior to examination. PERRL. EOMI. Head: Atraumatic. Nose: No congestion/rhinnorhea. Neck: No stridor.   Cardiovascular: Normal rate, regular rhythm. Grossly  normal heart sounds.  Good peripheral circulation. Respiratory: Normal respiratory effort.  No retractions. Lungs CTAB. Musculoskeletal: Moves upper and lower extremities without any difficulty. Normal gait was noted. Neurologic:  Normal speech and language. No gross focal neurologic deficits are appreciated. No gait instability. Skin:  Skin is warm, dry and intact. No rash noted. Psychiatric: Mood and affect are normal. Speech and behavior are normal.  ____________________________________________   LABS (all labs ordered are listed, but only abnormal results are displayed)  Labs Reviewed - No data to display  PROCEDURES  Procedure(s) performed: None  Procedures  Critical Care performed: No  ____________________________________________   INITIAL IMPRESSION / ASSESSMENT AND PLAN / ED COURSE  Pertinent labs & imaging results that were available during my care of the patient were reviewed by me and considered in my medical decision making (see chart for details).  Visual acuity was reviewed. Patient was placed and Tobrex ophthalmic solution 2 drops to each eye every 4 hours while awake. He is instructed to follow-up with [facility] if any continued problems.      ____________________________________________   FINAL CLINICAL IMPRESSION(S) / ED DIAGNOSES  Final diagnoses:  Bacterial conjunctivitis of both eyes      NEW MEDICATIONS STARTED DURING THIS VISIT:  Discharge Medication List as of 12/15/2016  4:34 PM    START taking these medications   Details  tobramycin (TOBREX) 0.3 % ophthalmic solution Place 2 drops into both eyes every 4 (four) hours. While awake, Starting Thu 12/15/2016, Print         Note:  This document was prepared using Dragon voice recognition software and may include unintentional dictation errors.    Tommi Rumps, PA-C 12/15/16 1645    Minna Antis, MD 12/16/16 2035

## 2016-12-15 NOTE — ED Triage Notes (Signed)
States he developed pain and irritation to right eye over the weekend
